# Patient Record
Sex: Male | Born: 1985 | Race: White | Hispanic: No | Smoking: Never smoker
Health system: Southern US, Community
[De-identification: ages and names within clinical notes are randomized; demographics above are authoritative.]

## PROBLEM LIST (undated history)

## (undated) HISTORY — PX: APPENDECTOMY: SHX54

---

## 2007-02-10 ENCOUNTER — Inpatient Hospital Stay: Payer: Self-pay | Admitting: General Surgery

## 2007-12-13 ENCOUNTER — Emergency Department: Payer: Self-pay | Admitting: Emergency Medicine

## 2018-05-26 ENCOUNTER — Other Ambulatory Visit: Payer: Self-pay | Admitting: Genetics

## 2018-05-26 DIAGNOSIS — Z1371 Encounter for nonprocreative screening for genetic disease carrier status: Secondary | ICD-10-CM

## 2018-05-26 DIAGNOSIS — Z13228 Encounter for screening for other metabolic disorders: Secondary | ICD-10-CM

## 2018-05-26 NOTE — Progress Notes (Signed)
FOB initially interested in carrier screening for cystic fibrosis, as partner has been identified as a carrier.  Couple ultimately elected to postpone testing today to further discuss their desire for screening [place] during pregnancy.  Will contact prenatal genetics should they desire carrier testing at a later date.  We reviewed the availability of [place] newborn screening program for cystic fibrosis.

## 2019-04-07 ENCOUNTER — Other Ambulatory Visit: Payer: Self-pay | Admitting: Family Medicine

## 2019-04-07 ENCOUNTER — Ambulatory Visit
Admission: RE | Admit: 2019-04-07 | Discharge: 2019-04-07 | Disposition: A | Discharge status: Home / Self Care | Payer: Medicaid Other | Source: Ambulatory Visit | Attending: Family Medicine | Admitting: Family Medicine

## 2019-04-07 DIAGNOSIS — R52 Pain, unspecified: Secondary | ICD-10-CM

## 2019-04-21 ENCOUNTER — Encounter: Payer: Self-pay | Admitting: Physical Therapy

## 2019-04-21 ENCOUNTER — Other Ambulatory Visit: Payer: Self-pay

## 2019-04-21 ENCOUNTER — Ambulatory Visit: Payer: Medicaid Other | Attending: Family Medicine | Admitting: Physical Therapy

## 2019-04-21 DIAGNOSIS — M5442 Lumbago with sciatica, left side: Secondary | ICD-10-CM | POA: Diagnosis not present

## 2019-04-21 DIAGNOSIS — M62830 Muscle spasm of back: Secondary | ICD-10-CM | POA: Insufficient documentation

## 2019-04-21 DIAGNOSIS — M5441 Lumbago with sciatica, right side: Secondary | ICD-10-CM | POA: Insufficient documentation

## 2019-04-21 NOTE — Therapy (Signed)
[facility] [address] [place], [address] Phone: [phone]   Fax:  [phone]  Physical Therapy Evaluation  Patient Details  Name: Michael Mccarthy MRN: [id number] Date of Birth: 1986-04-15 No data recorded  Encounter Date: 04/21/2019  PT End of Session - 04/21/19 1023    Visit Number  1    Number of Visits  13    Date for PT Re-Evaluation  06/02/19    PT Start Time  0948    PT Stop Time  1025    PT Time Calculation (min)  37 min    Behavior During Therapy  [facility] for tasks assessed/performed       History reviewed. No pertinent past medical history.  History reviewed. No pertinent surgical history.  There were no vitals filed for this visit.   OBJECTIVE  Mental Status Patient is oriented to person, place and time.  Recent memory is intact.  Remote memory is intact.  Attention span and concentration are intact.  Expressive speech is intact.  Patient's fund of knowledge is within normal limits for educational level.  SENSATION: Grossly intact to light touch bilateral LEs as determined by testing dermatomes L2-S2 Proprioception and hot/cold testing deferred on this date   MUSCULOSKELETAL: Tremor: None Bulk: Normal Tone: Normal    Posture Forward head, rounded shoulder, reduced lumbar lordosis.  Gait Normalized gait, minimal trunk rotation.    Palpation Visible swelling over L side of L2-S1, discomfort with noted tension/trigger points over L lumbar paraspinals/QL.    Strength (out of 5) R/L 5/5 Hip flexion 5/5 Hip ER 5/5 Hip IR 5/5 Hip abduction 5/5 Hip adduction 5/5 Hip extension 5/5 Knee extension 5/5 Knee flexion 5/5 Ankle dorsiflexion *Indicates pain   AROM (degrees) R/L (all movements include overpressure unless otherwise stated) Lumbar forward flexion (0-65):  25% limited with pain Lumbar extension (0-30): 75% limited with pain  Lumbar lateral flexion (0-25): WNL  bilat Lumbar rotation:  Pain at L lumbar paraspinals with L rotation 25% limited; R rotation WNL All hip motion WNL  PROM (degrees) PROM = AROM  Repeated Movements Centralization with repeated standing ext in minimal ROM  Passive Accessory Intervertebral Motion (PAIVM) Pt denies reproduction of back pain with CPA L1-L5 and UPA bilaterally L1-L5. Generally hypomobile throughout  Passive Physiological Intervertebral Motion (PPIVM) Normal flexion and extension with PPIVM testing   SPECIAL TESTS Slump: Negative bilat SLR: R:  Pain at L lumbar paraspinals with L rotation 25% limited; R rotation WNL Crossed SLR: L NEgative FABER: R: Positive for tension, minimal pain L: Negative FADIR: R: Positive for tension, minimal pain L: Negative  Hip scour: Negative bilat 90/90 Hamstring R Positive with pain L Positive without pain  Obers Negative bilat  Ther-Ex SKTC 10sec hold Lateral seated child pose 10sec hold Repeated lumbar ext x10 with centralization Education on repeated motion on pain, and stretching in pain free range with good understanding.  Evaluation shortened d/t patient arriving 85mins late. Would benefit from functional movement assessment next visit should time allow.              .   Pt will increase strength of by at least 1/2 MMT grade in order to demonstrate improvement in strength and function.          Subjective Assessment - 04/21/19 0950    Subjective  L sided LBP    Pertinent History  Patinet is a 33 year old with low back pain worsening  over the past month. Pt reports his pain started after he installed a window unit at the low back, felt like a pulled muscle. Following this, he reports he had L hip and post leg pain that feels like it is in his bone. The radiating pain has gotten better since predinsone taper he finished last week, but LBP persists. Has perscription for hydrocodone, but does not take them. Reports bilat LE numbness if he sits for  10mins. Reports L side of LBP is worse than R, feels dull and achy. Works full time in [employer] representative[employer], has difficulty with work duties d/t pain. Reports no difficulty with ADLS. Pt reports worst pain over the past week 8/10 best 3/10. Pt denies N/V, B&B changes, unexplained weight fluctuation, saddle paresthesia, fever, night sweats, or unrelenting night pain at this time.    Limitations  Sitting;Lifting;House hold activities    How long can you sit comfortably?  10mins    How long can you stand comfortably?  unlimited    How long can you walk comfortably?  unlimited    Diagnostic tests  Slight scoliosis. Slight disc space narrowing at L4-5 and L5-S1. No appreciable facet arthropathy. No fracture or spondylolisthesis.    Patient Stated Goals  decrease pain    Currently in Pain?  Yes    Pain Score  3     Pain Location  Back    Pain Orientation  Posterior;Lower;Left    Pain Descriptors / Indicators  Aching;Dull    Pain Type  Chronic pain    Pain Radiating Towards  bilat LEs with sitting    Pain Onset  More than a month ago    Pain Frequency  Constant    Aggravating Factors   bending, lifting, sitting    Pain Relieving Factors  predinsone taper    Effect of Pain on Daily Activities  Unable work duties without pain                    Objective measurements completed on examination: See above findings.              PT Education - 04/21/19 1022    Education Details  Patient was educated on diagnosis, anatomy and pathology involved, prognosis, role of PT, and was given an HEP, demonstrating exercise with proper form following verbal and tactile cues, and was given a paper hand out to continue exercise at home. Pt was educated on and agreed to plan of care.    Person(s) Educated  Patient    Methods  Explanation;Demonstration;Verbal cues;Handout    Comprehension  Verbalized understanding;Returned demonstration;Verbal cues required       PT Short Term Goals - 04/21/19 1024       PT SHORT TERM GOAL #1   Title  Pt will be independent with HEP in order to improve strength and decrease back pain in order to improve pain-free function at home and work.    Baseline  04/21/19 HEP handout given    Time  4    Period  Weeks    Status  New        PT Long Term Goals - 04/21/19 1025      PT LONG TERM GOAL #1   Title  Pt will decrease mODI scoreby at least 13 points in order demonstrate clinically significant reduction in back pain/disability.    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 Current impairments in core strength, back pain, BLE and trunk ROM, and LE sensation deficits. 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 Patient will benefit from skilled PT to address impairments in order to return to safe, PLOF and decrease pain to increase efficiency at work and QOL.    Personal Factors and Comorbidities  Education;Profession;Fitness;Comorbidity 1    Comorbidities  obesity    Examination-Activity Limitations  Sit;Lift;Squat;Carry     Examination-Participation Restrictions  Community Activity;Driving;Other   Holiday representative work   Conservation officer, historic buildings  Evolving/Moderate complexity    Clinical Decision Making  Moderate    Rehab Potential  Good    PT Frequency  2x / week    PT Duration  6 weeks    PT Treatment/Interventions  Aquatic Therapy;Moist Heat;Traction;Dry needling;Joint Manipulations;Spinal Manipulations;Passive range of motion;Manual techniques;Electrical Stimulation;Cryotherapy;ADLs/Self Care Home Management;Ultrasound;Therapeutic exercise;Therapeutic activities;Functional mobility training;Neuromuscular re-education;Patient/family education    PT Next Visit Plan  HEP review, possible TDN, squat assessment    PT Home Exercise Plan  SKTC, childs pose seated, repeated ext    Consulted and Agree with Plan of Care  Patient       Patient will benefit from skilled therapeutic intervention in order to improve the following deficits and impairments:  Decreased endurance, Decreased mobility, Increased muscle spasms, Impaired sensation, Improper body mechanics, Decreased range of motion, Decreased activity tolerance, Decreased strength, Increased fascial restricitons, Impaired flexibility, Postural dysfunction, Pain  Visit Diagnosis: Acute left-sided low back pain with bilateral sciatica  Muscle spasm of back     Problem List There are no active problems to display for this patient.  Staci Acosta PT, DPT Staci Acosta 04/21/2019, 1:20 PM  [facility] [address] [place], [address] Phone: [phone]   Fax:  [phone]  Name: ARSHAUN ROSENGRANT MRN: [id number] Date of Birth: Feb 18, 1986

## 2019-04-28 ENCOUNTER — Encounter: Payer: Medicaid Other | Admitting: Physical Therapy

## 2019-04-29 ENCOUNTER — Ambulatory Visit: Payer: Medicaid Other | Admitting: Physical Therapy

## 2019-04-30 ENCOUNTER — Encounter: Payer: Medicaid Other | Admitting: Physical Therapy

## 2019-05-05 ENCOUNTER — Ambulatory Visit: Payer: Medicaid Other | Admitting: Physical Therapy

## 2019-05-05 DIAGNOSIS — M545 Low back pain, unspecified: Secondary | ICD-10-CM | POA: Insufficient documentation

## 2019-05-07 ENCOUNTER — Ambulatory Visit: Payer: Medicaid Other | Admitting: Physical Therapy

## 2019-05-11 ENCOUNTER — Ambulatory Visit: Payer: Medicaid Other | Admitting: Physical Therapy

## 2019-05-14 ENCOUNTER — Ambulatory Visit: Payer: Medicaid Other | Admitting: Physical Therapy

## 2019-05-18 ENCOUNTER — Encounter: Payer: Medicaid Other | Admitting: Physical Therapy

## 2019-05-21 ENCOUNTER — Encounter: Payer: Medicaid Other | Admitting: Physical Therapy

## 2019-07-15 ENCOUNTER — Encounter: Payer: Self-pay | Admitting: Emergency Medicine

## 2019-07-15 ENCOUNTER — Other Ambulatory Visit: Payer: Self-pay

## 2019-07-15 ENCOUNTER — Emergency Department
Admission: EM | Admit: 2019-07-15 | Discharge: 2019-07-16 | Disposition: A | Discharge status: Home / Self Care | Payer: Medicaid Other | Attending: Student | Admitting: Student

## 2019-07-15 DIAGNOSIS — R1033 Periumbilical pain: Secondary | ICD-10-CM | POA: Diagnosis not present

## 2019-07-15 DIAGNOSIS — R110 Nausea: Secondary | ICD-10-CM | POA: Diagnosis not present

## 2019-07-15 DIAGNOSIS — K529 Noninfective gastroenteritis and colitis, unspecified: Secondary | ICD-10-CM | POA: Insufficient documentation

## 2019-07-15 DIAGNOSIS — R1013 Epigastric pain: Secondary | ICD-10-CM | POA: Diagnosis present

## 2019-07-15 LAB — URINALYSIS, COMPLETE (UACMP) WITH MICROSCOPIC
Bacteria, UA: NONE SEEN
Bilirubin Urine: NEGATIVE
Glucose, UA: NEGATIVE mg/dL
Hgb urine dipstick: NEGATIVE
Ketones, ur: NEGATIVE mg/dL
Leukocytes,Ua: NEGATIVE
Nitrite: NEGATIVE
Protein, ur: NEGATIVE mg/dL
Specific Gravity, Urine: 1.017 (ref 1.005–1.030)
Squamous Epithelial / HPF: NONE SEEN (ref 0–5)
WBC, UA: NONE SEEN WBC/hpf (ref 0–5)
pH: 5 (ref 5.0–8.0)

## 2019-07-15 LAB — TROPONIN I (HIGH SENSITIVITY): Troponin I (High Sensitivity): 7 ng/L (ref <18)

## 2019-07-15 LAB — COMPREHENSIVE METABOLIC PANEL
ALT: 27 U/L (ref 0–44)
AST: 19 U/L (ref 15–41)
Albumin: 3.9 g/dL (ref 3.5–5.0)
Alkaline Phosphatase: 57 U/L (ref 38–126)
Anion gap: 10 (ref 5–15)
BUN: 12 mg/dL (ref 6–20)
CO2: 24 mmol/L (ref 22–32)
Calcium: 10.2 mg/dL (ref 8.9–10.3)
Chloride: 105 mmol/L (ref 98–111)
Creatinine, Ser: 0.94 mg/dL (ref 0.61–1.24)
GFR calc Af Amer: >60 mL/min (ref >60)
GFR calc non Af Amer: >60 mL/min (ref >60)
Glucose, Bld: 118 mg/dL — ABNORMAL HIGH (ref 70–99)
Potassium: 3.8 mmol/L (ref 3.5–5.1)
Sodium: 139 mmol/L (ref 135–145)
Total Bilirubin: 0.9 mg/dL (ref 0.3–1.2)
Total Protein: 7.4 g/dL (ref 6.5–8.1)

## 2019-07-15 LAB — CBC
HCT: 48.6 % (ref 39.0–52.0)
Hemoglobin: 15.9 g/dL (ref 13.0–17.0)
MCH: 29 pg (ref 26.0–34.0)
MCHC: 32.7 g/dL (ref 30.0–36.0)
MCV: 88.5 fL (ref 80.0–100.0)
Platelets: 329 10*3/uL (ref 150–400)
RBC: 5.49 MIL/uL (ref 4.22–5.81)
RDW: 13.5 % (ref 11.5–15.5)
WBC: 13.1 10*3/uL — ABNORMAL HIGH (ref 4.0–10.5)
nRBC: 0 % (ref 0.0–0.2)

## 2019-07-15 LAB — LIPASE, BLOOD: Lipase: 30 U/L (ref 11–51)

## 2019-07-15 MED ORDER — SODIUM CHLORIDE 0.9% FLUSH: 3.0000 mL | Freq: Once | INTRAVENOUS | Status: DC

## 2019-07-15 MED ORDER — SODIUM CHLORIDE 0.9 % IV BOLUS
1000.0000 mL | Freq: Once | INTRAVENOUS | Status: AC
  Administered 2019-07-15: 1000 mL via INTRAVENOUS

## 2019-07-15 MED ORDER — FENTANYL CITRATE (PF) 100 MCG/2ML IJ SOLN
50.0000 ug | Freq: Once | INTRAMUSCULAR | Status: AC
  Administered 2019-07-15: 50 ug via INTRAVENOUS
  Filled 2019-07-15: qty 2

## 2019-07-15 NOTE — ED Notes (Signed)
Pt denies any c/o's pain at this time, but reports it comes on regularly and is "excrucitating".

## 2019-07-15 NOTE — ED Triage Notes (Signed)
C/O mid/ upper abdominal pain x 1 day.  Symptoms include nausea.  AAOx3.  Skin warm and dry. NAD

## 2019-07-15 NOTE — ED Provider Notes (Signed)
[facility] Emergency Department Provider Note  ____________________________________________   First MD Initiated Contact with Patient 07/15/19 2301     (approximate)  I have reviewed the triage vital signs and the nursing notes.  History  Chief Complaint Abdominal Pain    HPI BRANDUN PINN is a 33 y.o. male with no significant medical history who presents to the emergency department for abdominal pain.  Patient reports symptoms started 2 days ago.  Primarily epigastric and periumbilical in location.  Pain is intermittent, comes and goes in waves with no identifiable inciting factors, however he does notice that the pain is worsened by eating. No alleviating factors. Described as sharp, moderate in severity.  No radiation.  Associated with nausea, but no vomiting or diarrhea.  No fevers.  He states symptoms feel similar to when he was diagnosed with appendicitis.     Past Medical Hx History reviewed. No pertinent past medical history.  Problem List There are no active problems to display for this patient.   Past Surgical Hx Past Surgical History:  Procedure Laterality Date  . APPENDECTOMY      Medications Prior to Admission medications   Not on File    Allergies Vicodin [hydrocodone-acetaminophen]  Family Hx No family history on file.  Social Hx Social History   Tobacco Use  . Smoking status: Never Smoker  . Smokeless tobacco: Never Used  Substance Use Topics  . Alcohol use: Not on file  . Drug use: Not on file     Review of Systems  Constitutional: Negative for fever, chills. Eyes: Negative for visual changes. ENT: Negative for sore throat. Cardiovascular: Negative for chest pain. Respiratory: Negative for shortness of breath. Gastrointestinal: Negative for nausea, vomiting. + abdominal pain Genitourinary: Negative for dysuria. Musculoskeletal: Negative for leg swelling. Skin: Negative for rash. Neurological: Negative for  for headaches.   Physical Exam  Vital Signs: ED Triage Vitals  Enc Vitals Group     BP 07/15/19 1819 125/71     Pulse Rate 07/15/19 1819 100     Resp 07/15/19 1819 16     Temp 07/15/19 1819 98.7 F (37.1 C)     Temp Source 07/15/19 1819 Oral     SpO2 07/15/19 1819 96 %     Weight 07/15/19 1814 280 lb (127 kg)     Height 07/15/19 1814 6' (1.829 m)     Head Circumference --      Peak Flow --      Pain Score 07/15/19 2307 0     Pain Loc --      Pain Edu? --      Excl. in GC? --     Constitutional: Alert and oriented.  Head: Normocephalic. Atraumatic. Eyes: Conjunctivae clear. Sclera anicteric. Nose: No congestion. No rhinorrhea. Mouth/Throat: Wearing mask.  Neck: No stridor.   Cardiovascular: Normal rate, regular rhythm. Extremities well perfused. Respiratory: Normal respiratory effort.  Lungs CTAB. Gastrointestinal: Soft. TTP periumbilically. No rebound or guarding. Non-distended.  Musculoskeletal: No lower extremity edema. No deformities. Neurologic:  Normal speech and language. No gross focal neurologic deficits are appreciated.  Skin: Skin is warm, dry and intact. No rash noted. Psychiatric: Mood and affect are appropriate for situation.  EKG  Personally reviewed.   Rate: 87 Rhythm: sinus Axis: normal Intervals: WNL No STEMI    Radiology  CT: IMPRESSION:  Changes consistent with inflammatory bowel disease in several of the  distal ileal loops. No definitive obstruction is seen although some  mild  prominence of the more proximal ileum is noted. The jejunum is  unremarkable.   Changes consistent with prior appendectomy.    Procedures  Procedure(s) performed (including critical care):  Procedures   Initial Impression / Assessment and Plan / ED Course  33 y.o. male who presents to the ED who presents for periumbilical abdominal pain. S/p appendectomy.  Ddx: pancreatitis, gastritis, biliary colic, colitis, nephrolithiasis  Will obtain labs,  imaging, symptom control and reassess.   Labs reveal mild leukocytosis to 13.1. Otherwise electrolytes, lipase, LFTs, urinalysis without actionable derangements.  CT scan reveals changes consistent with inflammatory bowel disease in the distal ileal loops, no obstruction.  Discussed findings with patient.  He states he was once told many years ago as a pediatric patient that he had an "issue" with his intestines.  Suspect likely underlying Crohn's versus UC. He has never been evaluated by GI before. Advised close follow-up with GI, referral placed. Pain improved and patient has tolerated PO in the ED.  Otherwise, plan for symptomatic care with Rx for Zofran, Bentyl.   Patient voices understanding and is comfortable with the plan and discharge.   Final Clinical Impression(s) / ED Diagnosis  Final diagnoses:  Periumbilical abdominal pain       Note:  This document was prepared using Dragon voice recognition software and may include unintentional dictation errors.   Lilia Pro., MD 07/16/19 [phone]

## 2019-07-16 ENCOUNTER — Encounter: Payer: Self-pay | Admitting: Radiology

## 2019-07-16 ENCOUNTER — Emergency Department: Payer: Medicaid Other

## 2019-07-16 MED ORDER — MORPHINE SULFATE (PF) 2 MG/ML IV SOLN
2.0000 mg | Freq: Once | INTRAVENOUS | Status: AC
  Administered 2019-07-16: 2 mg via INTRAVENOUS
  Filled 2019-07-16: qty 1

## 2019-07-16 MED ORDER — ONDANSETRON HCL 4 MG/2ML IJ SOLN
4.0000 mg | Freq: Once | INTRAMUSCULAR | Status: AC
  Administered 2019-07-16: 4 mg via INTRAVENOUS
  Filled 2019-07-16: qty 2

## 2019-07-16 MED ORDER — IOHEXOL 300 MG/ML  SOLN
100.0000 mL | Freq: Once | INTRAMUSCULAR | Status: AC | PRN
  Administered 2019-07-16: 100 mL via INTRAVENOUS

## 2019-07-16 MED ORDER — OXYCODONE HCL 5 MG PO TABS
5.0000 mg | ORAL_TABLET | Freq: Once | ORAL | Status: AC
  Administered 2019-07-16: 5 mg via ORAL
  Filled 2019-07-16: qty 1

## 2019-07-16 MED ORDER — ONDANSETRON HCL 4 MG PO TABS: 4.0000 mg | ORAL_TABLET | Freq: Three times a day (TID) | ORAL | 0 refills | Status: AC | PRN

## 2019-07-16 MED ORDER — DICYCLOMINE HCL 10 MG PO CAPS
10.0000 mg | ORAL_CAPSULE | Freq: Once | ORAL | Status: AC
  Administered 2019-07-16: 10 mg via ORAL
  Filled 2019-07-16: qty 1

## 2019-07-16 MED ORDER — DICYCLOMINE HCL 20 MG PO TABS: 20.0000 mg | ORAL_TABLET | Freq: Three times a day (TID) | ORAL | 0 refills | Status: DC | PRN

## 2019-07-16 NOTE — Discharge Instructions (Addendum)
Thank you for letting us take care of you in the emergency department today.   Please follow up with: - The GI doctor, information below  Please return to the ER for any new or worsening symptoms.

## 2019-07-16 NOTE — ED Notes (Signed)
Pt given graham crackers and apple juice at this time to assess pt's ability to tolerate PO intake, per Dr Joan Mayans.

## 2019-09-14 ENCOUNTER — Encounter (INDEPENDENT_AMBULATORY_CARE_PROVIDER_SITE_OTHER): Payer: Self-pay

## 2019-09-14 ENCOUNTER — Ambulatory Visit: Payer: Medicaid Other | Admitting: Gastroenterology

## 2019-09-14 ENCOUNTER — Other Ambulatory Visit: Payer: Self-pay

## 2019-09-14 VITALS — BP 156/88 | HR 80 | Temp 97.5°F | Ht 72.0 in | Wt 290.8 lb

## 2019-09-14 DIAGNOSIS — Z791 Long term (current) use of non-steroidal anti-inflammatories (NSAID): Secondary | ICD-10-CM

## 2019-09-14 DIAGNOSIS — G43C1 Periodic headache syndromes in child or adult, intractable: Secondary | ICD-10-CM

## 2019-09-14 DIAGNOSIS — R933 Abnormal findings on diagnostic imaging of other parts of digestive tract: Secondary | ICD-10-CM | POA: Diagnosis not present

## 2019-09-14 NOTE — Progress Notes (Signed)
Jonathon Bellows MD, MRCP(U.K) [address]  [place]  [place], [place] [address]  Main: [phone]  Fax: [phone]   Gastroenterology Consultation  Referring Provider:   Emergency room  primary Care Physician:  Patient, No Pcp Per Primary Gastroenterologist:  Dr. Jonathon Bellows  Reason for Consultation:     Abdominal pain        HPI:   JUSTINE COSSIN is a 34 y.o. y/o male presented to the emergency room on 07/15/2019 with abdominal pain.  The pain began 2 days prior to his presentation.  He did have some nausea.  It was similar in nature when he was diagnosed with appendicitis.  Emergency room notes were reviewed and summarized below  He had a CT scan of the abdomen and pelvis with contrast which demonstrated features consistent with inflammatory bowel disease in several of the distal ileal loops.  No definite obstruction was seen although mild prominence of the proximal ileum was noted.  Jejunum was unremarkable.  Colon was within normal limits.  Changes were noted in the distal ileum.   07/15/2019: Hemoglobin 15.9, white cell count 13.1.  Platelet count 329.  LFTs normal.  He feels that at the age of 71 on one occasion he had severe abdominal pain and was admitted to [facility] for couple of days and was told he had a rare condition of his abdomen.  Since then he has had no treatment and no issues with his abdomen.  He states that for many years he has been taking ibuprofen 1200 to 1400 mg/day for headaches.  No protection in terms of acid production.  He does not see a neurologist and does not have a primary care physician.  No family history of ulcerative colitis or Crohn's disease.  He is not a smoker nor has he quit smoking presently.  Some joint pains but there are no skin rashes   No past medical history on file.  Past Surgical History:  Procedure Laterality Date  . APPENDECTOMY      Prior to Admission medications   Medication Sig Start Date End Date Taking? Authorizing  Provider  dicyclomine (BENTYL) 20 MG tablet Take 1 tablet (20 mg total) by mouth 3 (three) times daily as needed for spasms. 07/16/19 07/15/20  Lilia Pro., MD    No family history on file.   Social History   Tobacco Use  . Smoking status: Never Smoker  . Smokeless tobacco: Never Used  Substance Use Topics  . Alcohol use: Not on file  . Drug use: Not on file    Allergies as of 09/14/2019 - Review Complete 09/14/2019  Allergen Reaction Noted  . Vicodin [hydrocodone-acetaminophen]  07/15/2019    Review of Systems:    All systems reviewed and negative except where noted in HPI.   Physical Exam:  BP (!) 156/88   Pulse 80   Temp (!) 97.5 F (36.4 C)   Ht 6' (1.829 m)   Wt 290 lb 12.8 oz (131.9 kg)   BMI 39.44 kg/m  No LMP for male patient. Psych:  Alert and cooperative. Normal mood and affect. General:   Alert,  Well-developed, well-nourished, pleasant and cooperative in NAD Head:  Normocephalic and atraumatic. Eyes:  Sclera clear, no icterus.   Conjunctiva pink. Ears:  Normal auditory acuity. Neck:  Supple; no masses or thyromegaly. Lungs:  Respirations even and unlabored.  Clear throughout to auscultation.   No wheezes, crackles, or rhonchi. No acute distress. Heart:  Regular rate  and rhythm; no murmurs, clicks, rubs, or gallops. Abdomen:  Normal bowel sounds.  No bruits.  Soft, non-tender and non-distended without masses, hepatosplenomegaly or hernias noted.  No guarding or rebound tenderness.    Extremities:  No clubbing or edema.  No cyanosis. Neurologic:  Alert and oriented x3;  grossly normal neurologically. Psych:  Alert and cooperative. Normal mood and affect.  Imaging Studies: No results found.  Assessment and Plan:   JAMEIR AKE is a 34 y.o. y/o male has been referred for an abnormal CT scan of the abdomen which was noted on 07/15/2019 when he presented to the emergency room with abdominal pain of 2 days duration.  His CT scan of the abdomen shows  features which can be seen in Crohn's disease of the small bowel.  At times and enteritis due to an infection or NSAID related enteritis can present with similar issues.  At this point the decision to be made is does he have or  not have Crohn's disease.  It is very likely that all these changes are due to NSAID use for many years.  An MR enterography will look at the small bowel and evaluate for any active inflammation.  It is very sensitive for detection of the same.  I will also proceed with colonoscopy and biopsies of the terminal ileum.  Plan 1.  Fecal calprotectin, CRP, CBC, CMP 2.  MR enterography followed by colonoscopy and biopsies of the terminal ileum in 3 months 3.  Avoid all NSAIDs.  I have asked him to stop all NSAIDs right away and I will refer him to neurologist for his migraines 4.  I would also suggest him that he establishes care with a primary care physician.  I will be referring him to Vesta Mixer., Hassan Buckler   I have discussed alternative options, risks & benefits,  which include, but are not limited to, bleeding, infection, perforation,respiratory complication & drug reaction.  The patient agrees with this plan & written consent will be obtained.    Follow up in 4 months    Dr Wyline Mood MD,MRCP(U.K)

## 2019-09-15 ENCOUNTER — Encounter: Payer: Self-pay | Admitting: Gastroenterology

## 2019-09-15 LAB — COMPREHENSIVE METABOLIC PANEL
ALT: 37 IU/L (ref 0–44)
AST: 25 IU/L (ref 0–40)
Albumin/Globulin Ratio: 1.5 (ref 1.2–2.2)
Albumin: 4.1 g/dL (ref 4.0–5.0)
Alkaline Phosphatase: 72 IU/L (ref 39–117)
BUN/Creatinine Ratio: 11 (ref 9–20)
BUN: 11 mg/dL (ref 6–20)
Bilirubin Total: 0.4 mg/dL (ref 0.0–1.2)
CO2: 21 mmol/L (ref 20–29)
Calcium: 10.7 mg/dL — ABNORMAL HIGH (ref 8.7–10.2)
Chloride: 105 mmol/L (ref 96–106)
Creatinine, Ser: 1.01 mg/dL (ref 0.76–1.27)
GFR calc Af Amer: 111 mL/min/{1.73_m2} (ref >59)
GFR calc non Af Amer: 96 mL/min/{1.73_m2} (ref >59)
Globulin, Total: 2.7 g/dL (ref 1.5–4.5)
Glucose: 91 mg/dL (ref 65–99)
Potassium: 4.3 mmol/L (ref 3.5–5.2)
Sodium: 139 mmol/L (ref 134–144)
Total Protein: 6.8 g/dL (ref 6.0–8.5)

## 2019-09-15 LAB — CBC WITH DIFFERENTIAL/PLATELET
Basophils Absolute: 0.1 10*3/uL (ref 0.0–0.2)
Basos: 1 %
EOS (ABSOLUTE): 0.4 10*3/uL (ref 0.0–0.4)
Eos: 5 %
Hematocrit: 44.7 % (ref 37.5–51.0)
Hemoglobin: 15 g/dL (ref 13.0–17.7)
Immature Grans (Abs): 0 10*3/uL (ref 0.0–0.1)
Immature Granulocytes: 0 %
Lymphocytes Absolute: 3.1 10*3/uL (ref 0.7–3.1)
Lymphs: 40 %
MCH: 29 pg (ref 26.6–33.0)
MCHC: 33.6 g/dL (ref 31.5–35.7)
MCV: 86 fL (ref 79–97)
Monocytes Absolute: 0.6 10*3/uL (ref 0.1–0.9)
Monocytes: 7 %
Neutrophils Absolute: 3.7 10*3/uL (ref 1.4–7.0)
Neutrophils: 47 %
Platelets: 301 10*3/uL (ref 150–450)
RBC: 5.18 x10E6/uL (ref 4.14–5.80)
RDW: 12.7 % (ref 11.6–15.4)
WBC: 7.9 10*3/uL (ref 3.4–10.8)

## 2019-09-15 LAB — C-REACTIVE PROTEIN: CRP: 4 mg/L (ref 0–10)

## 2019-09-17 ENCOUNTER — Ambulatory Visit: Payer: Medicaid Other | Admitting: Gastroenterology

## 2019-10-02 LAB — CALPROTECTIN, FECAL: Calprotectin, Fecal: 37 ug/g (ref 0–120)

## 2019-10-11 ENCOUNTER — Encounter: Payer: Self-pay | Admitting: Gastroenterology

## 2019-10-14 NOTE — Progress Notes (Signed)
Virtual Visit via Video Note The purpose of this virtual visit is to provide medical care while limiting exposure to the novel coronavirus.    Consent was obtained for video visit:  Yes.   Answered questions that patient had about telehealth interaction:  Yes.   I discussed the limitations, risks, security and privacy concerns of performing an evaluation and management service by telemedicine. I also discussed with the patient that there may be a patient responsible charge related to this service. The patient expressed understanding and agreed to proceed.  Pt location: Home Physician Location: office Name of referring provider:  Wyline Mood, MD I connected with Michael Mccarthy at patients initiation/request on 10/16/2019 at  9:10 AM EST by video enabled telemedicine application and verified that I am speaking with the correct person using two identifiers. Pt MRN:  [id number] Pt DOB:  1986/04/22 Video Participants:  Michael Mccarthy   History of Present Illness:  Michael Mccarthy is a 34 year old male who presents for headaches.  History supplemented by ED and referring provider notes.  Onset:  Teenager Location:  Starts across front but with radiate up neck and back of head. Quality:  pounding Intensity:  Severe.  He denies new headache, thunderclap headache.  Sometimes wakes up with migraine. Aura:  none Premonitory Phase:  none Postdrome:  none Associated symptoms:  Nausea, vomiting, photophobia, phonophobia.  He denies associated visual disturbance, unilateral numbness or weakness. Duration:  All day or sooner if he vomits Frequency:  On average 2 to 3 a week Frequency of abortive medication: now nothing.  Previously took high doses of ibuprofen daily. Triggers:  unknown Relieving factors:  vomiting Activity:  Aggravates.  Needs to lay down in dark room.  He had been taking ibuprofen 1200mg  to 1600mg  daily.  He was recently seen by gastroenterology for abdominal pain.  He presented to  the ED in November for abdominal pain where CT of his abdomen showed inflammatory changes.  He is being worked up for disease.  He was advised to discontinue use of NSAIDs.  Current NSAIDS:  none Current analgesics:  none Current triptans:  none Current ergotamine:  none Current anti-emetic:  none Current muscle relaxants:  none Current anti-anxiolytic:  none Current sleep aide:  none Current Antihypertensive medications:  none Current Antidepressant medications:  none Current Anticonvulsant medications:  none Current anti-CGRP:  none Current Vitamins/Herbal/Supplements:  none Current Antihistamines/Decongestants:  none Other therapy:  none Hormone/birth control:  none  Past NSAIDS:  Ibuprofen; Aleve Past analgesics:  Excedrin migraine Past abortive triptans:  none Past abortive ergotamine:  none Past muscle relaxants:  none Past anti-emetic:  none Past antihypertensive medications:  none Past antidepressant medications:  none Past anticonvulsant medications:  none Past anti-CGRP:  none Past vitamins/Herbal/Supplements:  none Past antihistamines/decongestants:  none Other past therapies:  none  Caffeine:  Energy drinks.  Rarely coffee.  No soda. Alcohol:  rarely Diet:  Plenty of water Exercise:  Not routine.  December. Depression:  no; Anxiety:  some Other pain:  Shoulder pain Sleep:  Some trouble falling asleep Family history of headache:  Mother; grandmother; 2 aunts.  Past Medical History: No past medical history on file.  Medications: Outpatient Encounter Medications as of 10/16/2019  Medication Sig Note  . dicyclomine (BENTYL) 20 MG tablet Take 1 tablet (20 mg total) by mouth 3 (three) times daily as needed for spasms. 09/14/2019: Pt reports taking PRN   No facility-administered encounter medications on file as  of 10/16/2019.    Allergies: Allergies  Allergen Reactions  . Vicodin [Hydrocodone-Acetaminophen]     Family History: No family  history on file.  Social History: Social History   Socioeconomic History  . Marital status: Legally Separated    Spouse name: Not on file  . Number of children: Not on file  . Years of education: Not on file  . Highest education level: Not on file  Occupational History  . Not on file  Tobacco Use  . Smoking status: Never Smoker  . Smokeless tobacco: Never Used  Substance and Sexual Activity  . Alcohol use: Not on file  . Drug use: Not on file  . Sexual activity: Not on file  Other Topics Concern  . Not on file  Social History Narrative  . Not on file   Social Determinants of Health   Financial Resource Strain:   . Difficulty of Paying Living Expenses: Not on file  Food Insecurity:   . Worried About Charity fundraiser in the Last Year: Not on file  . Ran Out of Food in the Last Year: Not on file  Transportation Needs:   . Lack of Transportation (Medical): Not on file  . Lack of Transportation (Non-Medical): Not on file  Physical Activity:   . Days of Exercise per Week: Not on file  . Minutes of Exercise per Session: Not on file  Stress:   . Feeling of Stress : Not on file  Social Connections:   . Frequency of Communication with Friends and Family: Not on file  . Frequency of Social Gatherings with Friends and Family: Not on file  . Attends Religious Services: Not on file  . Active Member of Clubs or Organizations: Not on file  . Attends Archivist Meetings: Not on file  . Marital Status: Not on file  Intimate Partner Violence:   . Fear of Current or Ex-Partner: Not on file  . Emotionally Abused: Not on file  . Physically Abused: Not on file  . Sexually Abused: Not on file    Observations/Objective:   There were no vitals taken for this visit. No acute distress.  Alert and oriented.  Speech fluent and not dysarthric.  Language intact.  Eyes orthophoric on primary gaze.  Face symmetric.  Assessment and Plan:   Chronic migraine without aura, without  status migrainosus, intractable  1.  For preventative management, start nortriptyline 10mg  at bedtime.  We can increase dose to 25mg  in 4 weeks if needed. 2.  For abortive therapy, rizatriptan 10mg .  Zofran 4mg  for nausea PRN 3.  Limit use of pain relievers to no more than 2 days out of week to prevent risk of rebound or medication-overuse headache. 4.  Keep headache diary 5.  Exercise, hydration, caffeine cessation/discontinue energy drinks, sleep hygiene, monitor for and avoid triggers 6.  Follow up 4 months.   Follow Up Instructions:    -I discussed the assessment and treatment plan with the patient. The patient was provided an opportunity to ask questions and all were answered. The patient agreed with the plan and demonstrated an understanding of the instructions.   The patient was advised to call back or seek an in-person evaluation if the symptoms worsen or if the condition fails to improve as anticipated.    Dudley Major, DO

## 2019-10-16 ENCOUNTER — Other Ambulatory Visit: Payer: Self-pay

## 2019-10-16 ENCOUNTER — Encounter: Payer: Self-pay | Admitting: Neurology

## 2019-10-16 ENCOUNTER — Telehealth (INDEPENDENT_AMBULATORY_CARE_PROVIDER_SITE_OTHER): Payer: Medicaid Other | Admitting: Neurology

## 2019-10-16 DIAGNOSIS — G43019 Migraine without aura, intractable, without status migrainosus: Secondary | ICD-10-CM

## 2019-10-16 MED ORDER — NORTRIPTYLINE HCL 10 MG PO CAPS: 10.0000 mg | ORAL_CAPSULE | Freq: Every day | ORAL | 3 refills | Status: DC

## 2019-10-16 MED ORDER — ONDANSETRON HCL 4 MG PO TABS: 4.0000 mg | ORAL_TABLET | Freq: Three times a day (TID) | ORAL | 3 refills | Status: AC | PRN

## 2019-10-16 MED ORDER — RIZATRIPTAN BENZOATE 10 MG PO TABS: ORAL_TABLET | ORAL | 3 refills | Status: DC

## 2019-12-11 ENCOUNTER — Other Ambulatory Visit: Payer: Self-pay

## 2019-12-11 DIAGNOSIS — T1590XA Foreign body on external eye, part unspecified, unspecified eye, initial encounter: Secondary | ICD-10-CM

## 2019-12-11 NOTE — Progress Notes (Signed)
or

## 2019-12-14 ENCOUNTER — Ambulatory Visit: Payer: Medicaid Other

## 2019-12-14 ENCOUNTER — Ambulatory Visit: Admission: RE | Admit: 2019-12-14 | Payer: Medicaid Other | Source: Ambulatory Visit

## 2020-02-16 NOTE — Progress Notes (Deleted)
NEUROLOGY FOLLOW UP OFFICE NOTE  OSBORN PULLIN [id number]  HISTORY OF PRESENT ILLNESS: Michael Mccarthy is a 34 year old male who follows up for migraines.  UPDATE: Started nortriptyline in February.  Intensity:  *** Duration:  *** Frequency:  *** Frequency of abortive medication: *** Current NSAIDS:  none Current analgesics:  none Current triptans:  rizatriptan 10mg  Current ergotamine:  none Current anti-emetic:  Zofran 4mg  Current muscle relaxants:  none Current anti-anxiolytic:  none Current sleep aide:  none Current Antihypertensive medications:  none Current Antidepressant medications:  nortriptyline 10mg  at bedtime Current Anticonvulsant medications:  none Current anti-CGRP:  none Current Vitamins/Herbal/Supplements:  none Current Antihistamines/Decongestants:  none Other therapy:  none Hormone/birth control:  none  Caffeine:  Energy drinks.  Rarely coffee.  No soda. Alcohol:  rarely Diet:  Plenty of water Exercise:  Not routine.  . Depression:  no; Anxiety:  some Other pain:  Shoulder pain Sleep:  Some trouble falling asleep  HISTORY: Onset:  Teenager Location:  Starts across front but with radiate up neck and back of head. Quality:  pounding Initial intensity:  Severe.  He denies new headache, thunderclap headache.  Sometimes wakes up with migraine. Aura:  none Premonitory Phase:  none Postdrome:  none Associated symptoms:  Nausea, vomiting, photophobia, phonophobia.  He denies associated visual disturbance, unilateral numbness or weakness. Initial duration:  All day or sooner if he vomits Initial frequency:  On average 2 to 3 a week Initial frequency of abortive medication: now nothing.  Previously took high doses of ibuprofen daily. Triggers:  unknown Relieving factors:  vomiting Activity:  Aggravates.  Needs to lay down in dark room.  Past NSAIDS:  Ibuprofen; Aleve Past analgesics:  Excedrin migraine Past abortive triptans:   none Past abortive ergotamine:  none Past muscle relaxants:  none Past anti-emetic:  none Past antihypertensive medications:  none Past antidepressant medications:  none Past anticonvulsant medications:  none Past anti-CGRP:  none Past vitamins/Herbal/Supplements:  none Past antihistamines/decongestants:  none Other past therapies:  none   Family history of headache:  Mother; grandmother; 2 aunts.  PAST MEDICAL HISTORY: No past medical history on file.  MEDICATIONS: Current Outpatient Medications on File Prior to Visit  Medication Sig Dispense Refill  . dicyclomine (BENTYL) 20 MG tablet Take 1 tablet (20 mg total) by mouth 3 (three) times daily as needed for spasms. 30 tablet 0  . nortriptyline (PAMELOR) 10 MG capsule Take 1 capsule (10 mg total) by mouth at bedtime. 30 capsule 3  . ondansetron (ZOFRAN) 4 MG tablet Take 1 tablet (4 mg total) by mouth every 8 (eight) hours as needed for nausea or vomiting. 20 tablet 3  . rizatriptan (MAXALT) 10 MG tablet Take 1 tablet earliest onset of migraine.  May repeat in 2 hours if needed.  Maximum 2 tablets in 24 hours 10 tablet 3   No current facility-administered medications on file prior to visit.    ALLERGIES: Allergies  Allergen Reactions  . Vicodin [Hydrocodone-Acetaminophen]     FAMILY HISTORY: No family history on file.  SOCIAL HISTORY: Social History   Socioeconomic History  . Marital status: Legally Separated    Spouse name: Not on file  . Number of children: Not on file  . Years of education: Not on file  . Highest education level: Not on file  Occupational History  . Not on file  Tobacco Use  . Smoking status: Never Smoker  . Smokeless tobacco: Never Used  Substance and Sexual  Activity  . Alcohol use: Not on file  . Drug use: Not on file  . Sexual activity: Not on file  Other Topics Concern  . Not on file  Social History Narrative  . Not on file   Social Determinants of Health   Financial Resource  Strain:   . Difficulty of Paying Living Expenses:   Food Insecurity:   . Worried About Programme researcher, broadcasting/film/video in the Last Year:   . Barista in the Last Year:   Transportation Needs:   . Freight forwarder (Medical):   Marland Kitchen Lack of Transportation (Non-Medical):   Physical Activity:   . Days of Exercise per Week:   . Minutes of Exercise per Session:   Stress:   . Feeling of Stress :   Social Connections:   . Frequency of Communication with Friends and Family:   . Frequency of Social Gatherings with Friends and Family:   . Attends Religious Services:   . Active Member of Clubs or Organizations:   . Attends Banker Meetings:   Marland Kitchen Marital Status:   Intimate Partner Violence:   . Fear of Current or Ex-Partner:   . Emotionally Abused:   Marland Kitchen Physically Abused:   . Sexually Abused:      PHYSICAL EXAM: *** General: No acute distress.  Patient appears well-groomed.   Head:  Normocephalic/atraumatic Eyes:  Fundi examined but not visualized Neck: supple, no paraspinal tenderness, full range of motion Heart:  Regular rate and rhythm Lungs:  Clear to auscultation bilaterally Back: No paraspinal tenderness Neurological Exam: alert and oriented to person, place, and time. Attention span and concentration intact, recent and remote memory intact, fund of knowledge intact.  Speech fluent and not dysarthric, language intact.  CN II-XII intact. Bulk and tone normal, muscle strength 5/5 throughout.  Sensation to light touch, temperature and vibration intact.  Deep tendon reflexes 2+ throughout, toes downgoing.  Finger to nose and heel to shin testing intact.  Gait normal, Romberg negative.  IMPRESSION: *** migraine without aura, without status migrainosus, *** intractable  PLAN: 1.  For preventative management, *** 2.  For abortive therapy, *** 3.  Limit use of pain relievers to no more than 2 days out of week to prevent risk of rebound or medication-overuse headache. 4.  Keep  headache diary 5.  Exercise, hydration, caffeine cessation, sleep hygiene, monitor for and avoid triggers 6. Follow up ***   Shon Millet, DO

## 2020-02-18 ENCOUNTER — Ambulatory Visit: Payer: Medicaid Other | Admitting: Neurology

## 2020-04-14 ENCOUNTER — Other Ambulatory Visit: Payer: Self-pay | Admitting: Neurology

## 2020-04-14 NOTE — Telephone Encounter (Signed)
Patient called and scheduled a follow up with Dr. Everlena Cooper 05/18/20 and is requesting refills on his nortriptyline 10 MG and Maxalt 10 MG. He is out of the medications and has been experiencing "terrible migraines."  [facility] [place] on [address] [address] [address]

## 2020-04-15 ENCOUNTER — Other Ambulatory Visit: Payer: Self-pay

## 2020-04-15 MED ORDER — NORTRIPTYLINE HCL 10 MG PO CAPS: 10.0000 mg | ORAL_CAPSULE | Freq: Every day | ORAL | 0 refills | Status: DC

## 2020-04-15 MED ORDER — RIZATRIPTAN BENZOATE 10 MG PO TABS: ORAL_TABLET | ORAL | 3 refills | Status: DC

## 2020-04-15 NOTE — Telephone Encounter (Signed)
RX sent to pharmacy  

## 2020-04-26 ENCOUNTER — Ambulatory Visit (INDEPENDENT_AMBULATORY_CARE_PROVIDER_SITE_OTHER): Payer: Medicaid Other | Admitting: Neurology

## 2020-04-26 ENCOUNTER — Other Ambulatory Visit: Payer: Self-pay

## 2020-04-26 ENCOUNTER — Encounter: Payer: Self-pay | Admitting: Neurology

## 2020-04-26 VITALS — BP 150/110 | HR 98 | Ht 72.0 in | Wt 293.2 lb

## 2020-04-26 DIAGNOSIS — G43009 Migraine without aura, not intractable, without status migrainosus: Secondary | ICD-10-CM

## 2020-04-26 NOTE — Patient Instructions (Signed)
  1. Continue nortriptyline 10mg  at bedtime 2. Take rizatriptan 10mg  at earliest onset of headache.  May repeat dose once in 2 hours if needed.  Maximum 2 tablets in 24 hours. 3. Limit use of pain relievers to no more than 2 days out of the week.  These medications include acetaminophen, NSAIDs (ibuprofen/Advil/Motrin, naproxen/Aleve, triptans (Imitrex/sumatriptan), Excedrin, and narcotics.  This will help reduce risk of rebound headaches. 4. Be aware of common food triggers:  - Caffeine:  coffee, black tea, cola, Mt. Dew  - Chocolate  - Dairy:  aged cheeses (brie, blue, cheddar, gouda, [place], provolone, [place], Swiss, etc), chocolate milk, buttermilk, sour cream, limit eggs and yogurt  - Nuts, peanut butter  - Alcohol  - Cereals/grains:  FRESH breads (fresh bagels, sourdough, doughnuts), yeast productions  - Processed/canned/aged/cured meats (pre-packaged deli meats, hotdogs)  - MSG/glutamate:  soy sauce, flavor enhancer, pickled/preserved/marinated foods  - Sweeteners:  aspartame (Equal, Nutrasweet).  Sugar and Splenda are okay  - Vegetables:  legumes (lima beans, lentils, snow peas, fava beans, pinto peans, peas, garbanzo beans), sauerkraut, onions, olives, pickles  - Fruit:  avocados, bananas, citrus fruit (orange, lemon, grapefruit), mango  - Other:  Frozen meals, macaroni and cheese 5. Routine exercise 6. Stay adequately hydrated (aim for 64 oz water daily) 7. Keep headache diary 8. Maintain proper stress management 9. Maintain proper sleep hygiene 10. Do not skip meals 11. Consider supplements:  magnesium citrate 400mg  daily, riboflavin 400mg  daily, coenzyme Q10 100mg  three times daily.

## 2020-04-26 NOTE — Progress Notes (Signed)
NEUROLOGY FOLLOW UP OFFICE NOTE  Michael Mccarthy [id number]  HISTORY OF PRESENT ILLNESS: Michael Mccarthy is a 34 year old male who follows up for migraines.  UPDATE: Prescribed nortriptyline in February.   He has noticed significant improvement.  Intensity:  moderate Duration:  45 minutes Frequency:  3 in past 6 months Frequency of abortive medication: 3 days in past 6 months. Current NSAIDS:  none Current analgesics:  none Current triptans:  rizatriptan 10mg  Current ergotamine:  none Current anti-emetic:  Zofran 4mg  Current muscle relaxants:  none Current anti-anxiolytic:  none Current sleep aide:  none Current Antihypertensive medications:  none Current Antidepressant medications:  nortriptyline 10mg  Current Anticonvulsant medications:  none Current anti-CGRP:  none Current Vitamins/Herbal/Supplements:  none Current Antihistamines/Decongestants:  none Other therapy:  none Hormone/birth control:  none  Caffeine:  Energy drinks.  Rarely coffee.  No soda. Alcohol:  rarely Diet:  Plenty of water Exercise:  Not routine.  . Depression:  no; Anxiety:  some Other pain:  Shoulder pain Sleep:  Some trouble falling asleep  HISTORY: Onset:  Teenager Location:  Starts across front but with radiate up neck and back of head. Quality:  pounding Initial Intensity:  Severe.  He denies new headache, thunderclap headache.  Sometimes wakes up with migraine. Aura:  none Premonitory Phase:  none Postdrome:  none Associated symptoms:  Nausea, vomiting, photophobia, phonophobia.  He denies associated visual disturbance, unilateral numbness or weakness. Initial Duration:  All day or sooner if he vomits Initial Frequency:  On average 2 to 3 a week Initial Frequency of abortive medication: now nothing.  Previously took high doses of ibuprofen daily. Triggers:  unknown Relieving factors:  vomiting Activity:  Aggravates.  Needs to lay down in dark room.  He had been  taking ibuprofen 1200mg  to 1600mg  daily.  He was recently seen by gastroenterology for abdominal pain.  He presented to the ED in November for abdominal pain where CT of his abdomen showed inflammatory changes.  He is being worked up for disease.  He was advised to discontinue use of NSAIDs.   Past NSAIDS:  Ibuprofen; Aleve Past analgesics:  Excedrin migraine Past abortive triptans:  none Past abortive ergotamine:  none Past muscle relaxants:  none Past anti-emetic:  none Past antihypertensive medications:  none Past antidepressant medications:  none Past anticonvulsant medications:  none Past anti-CGRP:  none Past vitamins/Herbal/Supplements:  none Past antihistamines/decongestants:  none Other past therapies:  none   Family history of headache:  Mother; grandmother; 2 aunts.  PAST MEDICAL HISTORY: No past medical history on file.  MEDICATIONS: Current Outpatient Medications on File Prior to Visit  Medication Sig Dispense Refill  . dicyclomine (BENTYL) 20 MG tablet Take 1 tablet (20 mg total) by mouth 3 (three) times daily as needed for spasms. 30 tablet 0  . nortriptyline (PAMELOR) 10 MG capsule Take 1 capsule (10 mg total) by mouth at bedtime. 30 capsule 0  . ondansetron (ZOFRAN) 4 MG tablet Take 1 tablet (4 mg total) by mouth every 8 (eight) hours as needed for nausea or vomiting. 20 tablet 3  . rizatriptan (MAXALT) 10 MG tablet Take 1 tablet earliest onset of migraine.  May repeat in 2 hours if needed.  Maximum 2 tablets in 24 hours 10 tablet 3   No current facility-administered medications on file prior to visit.    ALLERGIES: Allergies  Allergen Reactions  . Vicodin [Hydrocodone-Acetaminophen]     FAMILY HISTORY: No family history on file.  SOCIAL HISTORY: Social History   Socioeconomic History  . Marital status: Legally Separated    Spouse name: Not on file  . Number of children: Not on file  . Years of education: Not on file  . Highest education  level: Not on file  Occupational History  . Not on file  Tobacco Use  . Smoking status: Never Smoker  . Smokeless tobacco: Never Used  Substance and Sexual Activity  . Alcohol use: Not on file  . Drug use: Not on file  . Sexual activity: Not on file  Other Topics Concern  . Not on file  Social History Narrative  . Not on file   Social Determinants of Health   Financial Resource Strain:   . Difficulty of Paying Living Expenses: Not on file  Food Insecurity:   . Worried About Programme researcher, broadcasting/film/video in the Last Year: Not on file  . Ran Out of Food in the Last Year: Not on file  Transportation Needs:   . Lack of Transportation (Medical): Not on file  . Lack of Transportation (Non-Medical): Not on file  Physical Activity:   . Days of Exercise per Week: Not on file  . Minutes of Exercise per Session: Not on file  Stress:   . Feeling of Stress : Not on file  Social Connections:   . Frequency of Communication with Friends and Family: Not on file  . Frequency of Social Gatherings with Friends and Family: Not on file  . Attends Religious Services: Not on file  . Active Member of Clubs or Organizations: Not on file  . Attends Banker Meetings: Not on file  . Marital Status: Not on file  Intimate Partner Violence:   . Fear of Current or Ex-Partner: Not on file  . Emotionally Abused: Not on file  . Physically Abused: Not on file  . Sexually Abused: Not on file    PHYSICAL EXAM: Blood pressure (!) 150/110, pulse 98, height 6' (1.829 m), weight 293 lb 3.2 oz (133 kg), SpO2 98 %. General: No acute distress.  Patient appears well-groomed.   Head:  Normocephalic/atraumatic Eyes:  Fundi examined but not visualized Neck: supple, no paraspinal tenderness, full range of motion Heart:  Regular rate and rhythm Lungs:  Clear to auscultation bilaterally Back: No paraspinal tenderness Neurological Exam: alert and oriented to person, place, and time. Attention span and  concentration intact, recent and remote memory intact, fund of knowledge intact.  Speech fluent and not dysarthric, language intact.  CN II-XII intact. Bulk and tone normal, muscle strength 5/5 throughout.  Sensation to light touch, temperature and vibration intact.  Deep tendon reflexes 2+ throughout, toes downgoing.  Finger to nose and heel to shin testing intact.  Gait normal, Romberg negative.  IMPRESSION: 1.  Migraine without aura, without status migrainosus, not intractable, much improved. 2.  Elevated blood pressure.  He attributes to having drank an energy drink.  PLAN: 1.  For preventative management, nortriptyline 10mg  at bedtime 2.  For abortive therapy, rizatriptan 10mg  3.  Limit use of pain relievers to no more than 2 days out of week to prevent risk of rebound or medication-overuse headache. 4.  Keep headache diary 5.  Exercise, hydration, caffeine cessation, sleep hygiene, monitor for and avoid triggers 6. Follow up blood pressure with PCP or Urgent Care 7. Follow up 6 months.  , DO

## 2020-05-03 ENCOUNTER — Other Ambulatory Visit: Payer: Self-pay | Admitting: Neurology

## 2020-05-18 ENCOUNTER — Ambulatory Visit: Payer: Medicaid Other | Admitting: Neurology

## 2020-06-02 ENCOUNTER — Other Ambulatory Visit: Payer: Self-pay | Admitting: Neurology

## 2020-10-21 NOTE — Progress Notes (Signed)
NEUROLOGY FOLLOW UP OFFICE NOTE  Michael Mccarthy [id number]  Assessment/Plan:   1.  Migraine without aura, without status migrainosus, not intractable 2.  HTN  1.  Migraine prevention:  Nortriptyline 10mg  at bedtime 2.  Migraine prevention:  Rizatriptan 10mg  3.  Limit use of pain relievers to no more than 2 days out of week to prevent risk of rebound or medication-overuse headache. 4.  Keep headache diary 5.  Follow up with PCP regarding blood pressure 6.  Follow up with me in one year or as needed.  Subjective:  . Bolds is a 35 year old male who follows up for migraines.  UPDATE: Blood pressure was in the 180s systolic.  Started HCTZ in December.   Intensity:  moderate Duration:  45 minutes Frequency:  2 a month. Frequency of abortive medication: 3 days in past 6 months. Current NSAIDS:none Current analgesics:none Current triptans:rizatriptan 10mg  Current ergotamine:none Current anti-emetic:Zofran 4mg  Current muscle relaxants:none Current anti-anxiolytic:none Current sleep aide:none Current Antihypertensive medications:none Current Antidepressant medications:nortriptyline 10mg  Current Anticonvulsant medications:none Current anti-CGRP:none Current Vitamins/Herbal/Supplements:none Current Antihistamines/Decongestants:none Other therapy:none Hormone/birth control:none  Caffeine:Energy drinks. Rarely coffee. No soda. Alcohol:rarely Diet:Plenty of water Exercise:Not routine. 30. Depression:no; Anxiety:some Other pain:Shoulder pain Sleep: Some trouble falling asleep  HISTORY: Onset:Teenager Location:Starts across front but with radiate up neck and back of head. Quality:pounding Initial Intensity:Severe.Hedenies new headache, thunderclap headache. Sometimes wakes up with migraine. Aura:none Premonitory Phase:none Postdrome:none Associated symptoms:Nausea, vomiting,  photophobia, phonophobia.Hedenies associated visual disturbance,unilateral numbness or weakness. Initial Duration:All day or sooner if he vomits Initial Frequency:On average 2 to 3 a week Initial Frequency of abortive medication:now nothing. Previously took high doses of ibuprofen daily. Triggers:unknown Relieving factors:vomiting Activity:Aggravates. Needs to lay down in dark room.  He hadbeen taking ibuprofen 1200mg  to 1600mg  daily. He was recently seen by gastroenterology for abdominal pain. He presented to the ED in November for abdominal pain where CT of his abdomen showed inflammatory changes. He is being worked up for disease. He was advised to discontinue use of NSAIDs.   Past NSAIDS:Ibuprofen; Aleve Past analgesics:Excedrin migraine Past abortive triptans:none Past abortive ergotamine:none Past muscle relaxants:none Past anti-emetic:none Past antihypertensive medications:none Past antidepressant medications:none Past anticonvulsant medications:none Past anti-CGRP:none Past vitamins/Herbal/Supplements:none Past antihistamines/decongestants:none Other past therapies:none   Family history of headache:Mother; grandmother; 2 aunts.  PAST MEDICAL HISTORY: No past medical history on file.  MEDICATIONS: Current Outpatient Medications on File Prior to Visit  Medication Sig Dispense Refill  . dicyclomine (BENTYL) 20 MG tablet Take 1 tablet (20 mg total) by mouth 3 (three) times daily as needed for spasms. 30 tablet 0  . nortriptyline (PAMELOR) 10 MG capsule TAKE 1 CAPSULE (10 MG TOTAL) BY MOUTH AT BEDTIME. 30 capsule 5  . ondansetron (ZOFRAN) 4 MG tablet Take 1 tablet (4 mg total) by mouth every 8 (eight) hours as needed for nausea or vomiting. 20 tablet 3  . rizatriptan (MAXALT) 10 MG tablet Take 1 tablet earliest onset of migraine.  May repeat in 2 hours if needed.  Maximum 2 tablets in 24 hours 10 tablet 3   No  current facility-administered medications on file prior to visit.    ALLERGIES: Allergies  Allergen Reactions  . Vicodin [Hydrocodone-Acetaminophen]     FAMILY HISTORY: No family history on file.   Objective:  Blood pressure (!) 147/99, pulse 79, resp. rate 18, height 6' (1.829 m), weight 299 lb (135.6 kg), SpO2 98 %. General: No acute distress.  Patient appears well-groomed.  Metta Clines, DO

## 2020-10-24 ENCOUNTER — Ambulatory Visit: Payer: Medicaid Other | Admitting: Neurology

## 2020-10-24 ENCOUNTER — Encounter: Payer: Self-pay | Admitting: Neurology

## 2020-10-24 ENCOUNTER — Other Ambulatory Visit: Payer: Self-pay

## 2020-10-24 VITALS — BP 147/99 | HR 79 | Resp 18 | Ht 72.0 in | Wt 299.0 lb

## 2020-10-24 DIAGNOSIS — G43009 Migraine without aura, not intractable, without status migrainosus: Secondary | ICD-10-CM | POA: Diagnosis not present

## 2020-10-24 DIAGNOSIS — I10 Essential (primary) hypertension: Secondary | ICD-10-CM

## 2020-10-24 MED ORDER — NORTRIPTYLINE HCL 10 MG PO CAPS: 10.0000 mg | ORAL_CAPSULE | Freq: Every day | ORAL | 5 refills | Status: DC

## 2020-10-24 MED ORDER — RIZATRIPTAN BENZOATE 10 MG PO TABS: ORAL_TABLET | ORAL | 5 refills | Status: DC

## 2020-10-24 NOTE — Patient Instructions (Signed)
Refilled nortriptyline and rizatriptan Limit use of pain relievers to no more than 2 days out of week to prevent risk of rebound or medication-overuse headache. Follow up one year

## 2021-02-01 ENCOUNTER — Other Ambulatory Visit: Payer: Self-pay | Admitting: Neurology

## 2021-02-05 IMAGING — CR LUMBAR SPINE - COMPLETE 4+ VIEW
1 series · 5 of 5 positions shown · non-contrast
Comparison: None.

CLINICAL DATA: Heavy object fell on patient

EXAM:
LUMBAR SPINE - COMPLETE 4+ VIEW

[Series 1: dg lumbar spine complete 4 +v · 0.14mm/px · 5 of 5 slices shown]
[im 1/5]
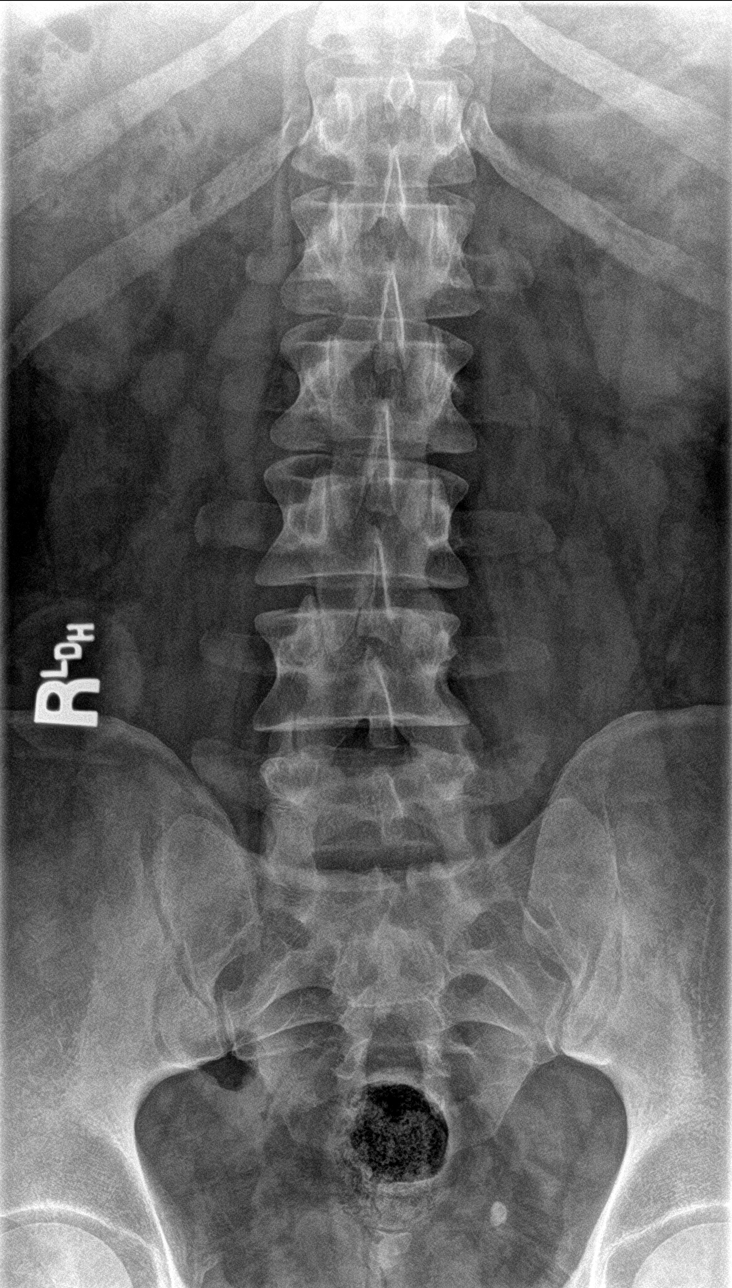
[im 2/5]
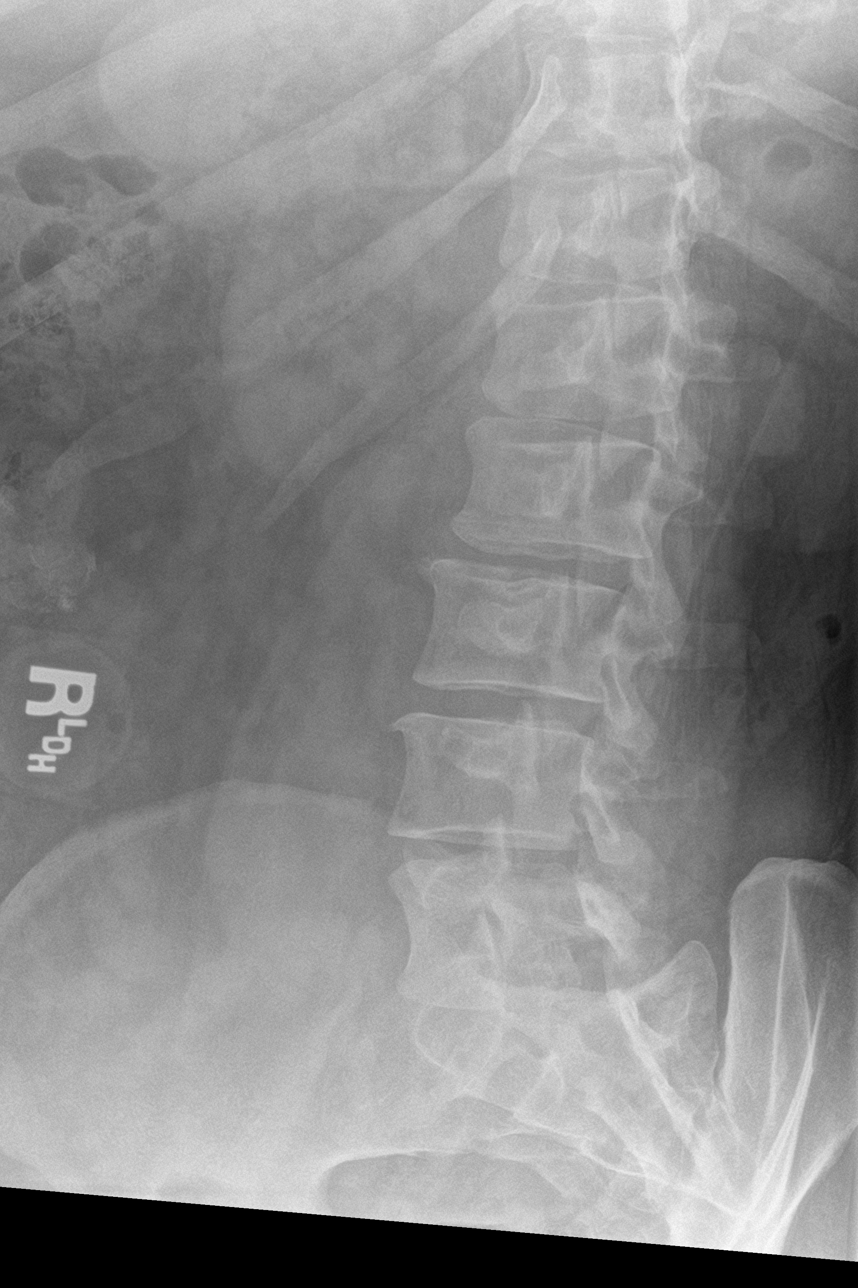
[im 3/5]
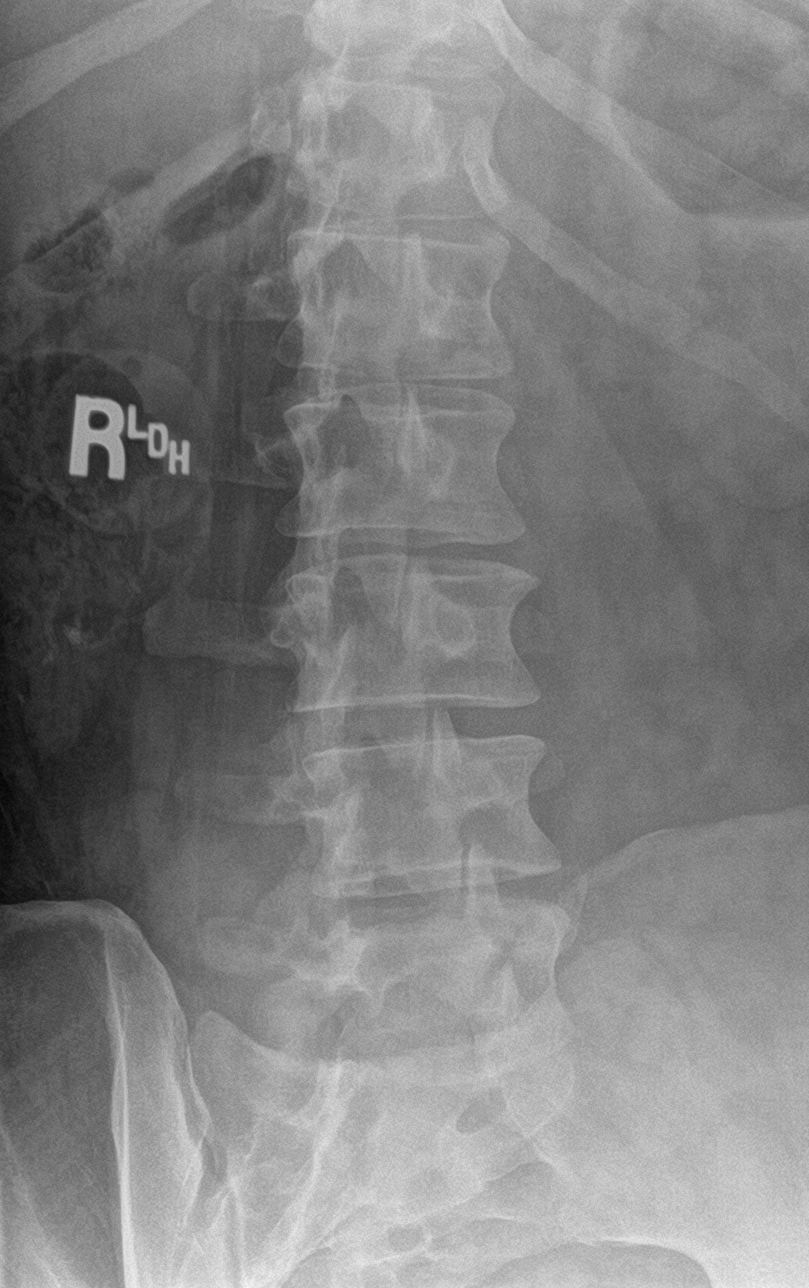
[im 4/5]
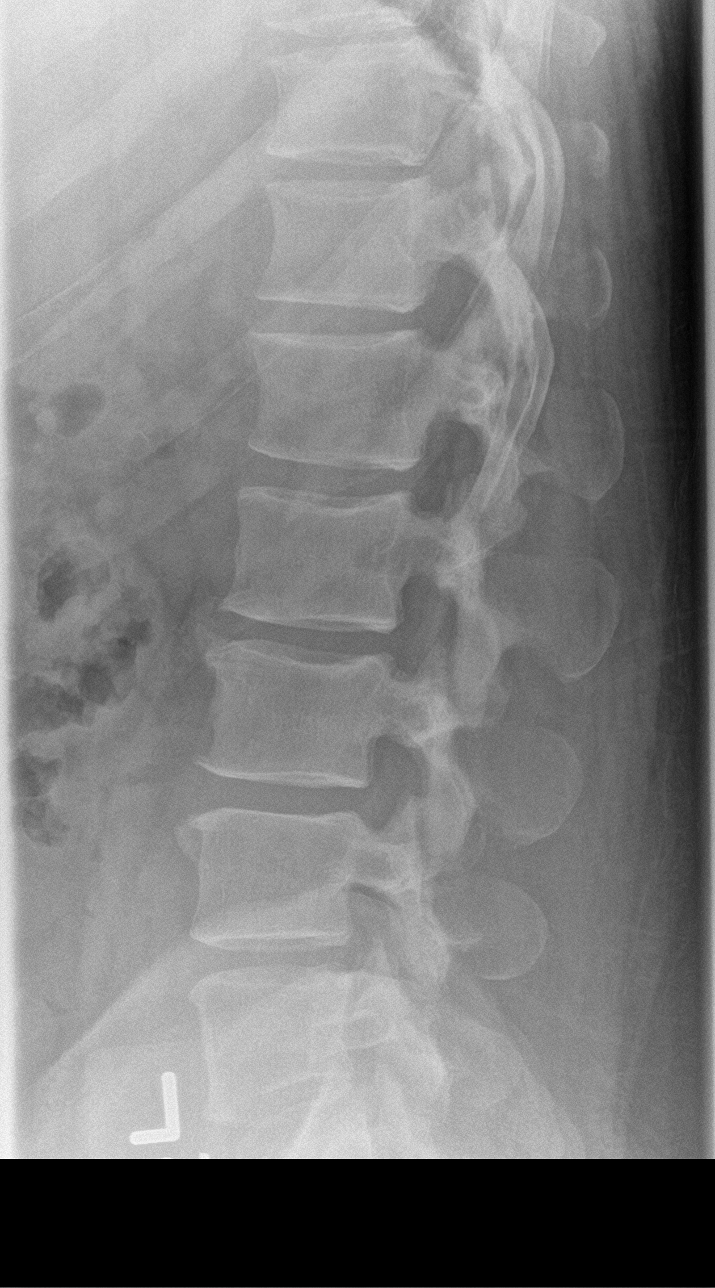
[im 5/5]
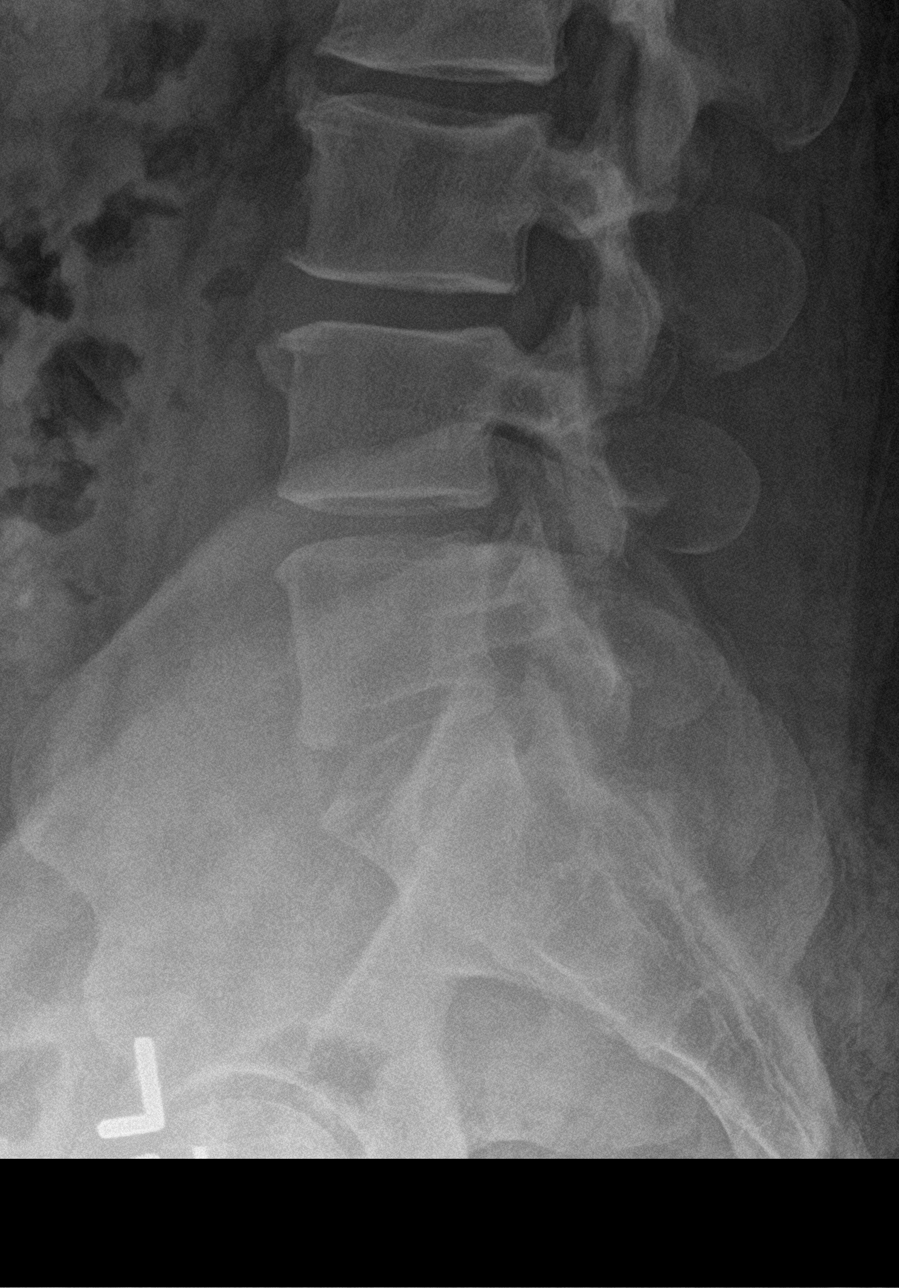

[5 of 5 positions shown; findings below may reference images not displayed]

FINDINGS: Frontal, lateral, spot lumbosacral lateral, and bilateral oblique
views were obtained. There are 5 non-rib-bearing lumbar type
vertebral bodies. There is slight lumbar dextroscoliosis. There is
no fracture or spondylolisthesis. There is slight disc space
narrowing at L4-5 and L5-S1. Other disc spaces appear unremarkable.
There is no appreciable facet arthropathy.
IMPRESSION: Slight scoliosis. Slight disc space narrowing at L4-5 and L5-S1. No
appreciable facet arthropathy. No fracture or spondylolisthesis.

## 2021-04-30 ENCOUNTER — Other Ambulatory Visit: Payer: Self-pay | Admitting: Neurology

## 2021-08-06 ENCOUNTER — Other Ambulatory Visit: Payer: Self-pay | Admitting: Neurology

## 2021-10-23 NOTE — Progress Notes (Signed)
? ?Virtual Visit via Video Note ?The purpose of this virtual visit is to provide medical care while limiting exposure to the novel coronavirus.   ? ?Consent was obtained for video visit:  Yes.   ?Answered questions that patient had about telehealth interaction:  Yes.   ?I discussed the limitations, risks, security and privacy concerns of performing an evaluation and management service by telemedicine. I also discussed with the patient that there may be a patient responsible charge related to this service. The patient expressed understanding and agreed to proceed. ? ?Pt location: Home ?Physician Location: office ?Name of referring provider:  No ref. provider found ?I connected with Jiles Prows at patients initiation/request on 10/24/2021 at 10:30 AM EST by video enabled telemedicine application and verified that I am speaking with the correct person using two identifiers. ?Pt MRN:  [id number] ?Pt DOB:  March 23, 1986 ?Video Participants:  ANGUS AMINI; ? ?Assessment and Plan:   ?1.  Migraine without aura, without status migrainosus, not intractable ?2.  HTN ?  ?1.  Migraine prevention: Medication not indicated. ?2.  Migraine prevention:  Rizatriptan 10mg  refilled ?3.  Limit use of pain relievers to no more than 2 days out of week to prevent risk of rebound or medication-overuse headache. ?4.  Keep headache diary ?5.  Follow up with PCP regarding blood pressure ?6.  Follow up with me in one year or as needed. ?  ?Subjective:  ?Michael Mccarthy is a 36 year old male who follows up for migraines. ?  ?UPDATE:  ?Blood pressure is controlled.  He lost 30 lbs.  No longer on nortriptyline.  He gets a migraine once in a while.   ?Intensity:  moderate ?Duration:  45 minutes ?Frequency:  2 a month. ?Frequency of abortive medication: 3 days in past 6 months. ?Current NSAIDS:  none ?Current analgesics:  none ?Current triptans:  rizatriptan 10mg  ?Current ergotamine:  none ?Current anti-emetic:  Zofran 4mg  ?Current muscle relaxants:   none ?Current anti-anxiolytic:  none ?Current sleep aide:  none ?Current Antihypertensive medications:  none ?Current Antidepressant medications:  none ?Current Anticonvulsant medications:  none ?Current anti-CGRP:  none ?Current Vitamins/Herbal/Supplements:  none ?Current Antihistamines/Decongestants:  none ?Other therapy:  none ?Hormone/birth control:  none ?  ?Caffeine:  Energy drinks.  Rarely coffee.  No soda. ?Alcohol:  rarely ?Diet:  Plenty of water ?Exercise:  Not routine.  20. ?Depression:  no; Anxiety:  some ?Other pain:  Shoulder pain ?Sleep:  Some trouble falling asleep ?  ?HISTORY:  ?Onset:  Teenager ?Location:  Starts across front but with radiate up neck and back of head. ?Quality:  pounding ?Initial Intensity:  Severe.  He denies new headache, thunderclap headache.  Sometimes wakes up with migraine. ?Aura:  none ?Premonitory Phase:  none ?Postdrome:  none ?Associated symptoms:  Nausea, vomiting, photophobia, phonophobia.  He denies associated visual disturbance, unilateral numbness or weakness. ?Initial Duration:  All day or sooner if he vomits ?Initial Frequency:  On average 2 to 3 a week ?Initial Frequency of abortive medication: now nothing.  Previously took high doses of ibuprofen daily. ?Triggers:  unknown ?Relieving factors:  vomiting ?Activity:  Aggravates.  Needs to lay down in dark room. ?  ?He had been taking ibuprofen 1200mg  to 1600mg  daily.  He was recently seen by gastroenterology for abdominal pain.  He presented to the ED in November for abdominal pain where CT of his abdomen showed inflammatory changes.  He is being worked up for disease.  He was  advised to discontinue use of NSAIDs. ?  ?  ?Past NSAIDS:  Ibuprofen; Aleve ?Past analgesics:  Excedrin migraine ?Past abortive triptans:  none ?Past abortive ergotamine:  none ?Past muscle relaxants:  none ?Past anti-emetic:  none ?Past antihypertensive medications:  none ?Past antidepressant medications:   nortriptyline 10mg  ?Past anticonvulsant medications:  none ?Past anti-CGRP:  none ?Past vitamins/Herbal/Supplements:  none ?Past antihistamines/decongestants:  none ?Other past therapies:  none ?  ?  ?Family history of headache:  Mother; grandmother; 2 aunts. ? ?Past Medical History: ?No past medical history on file. ? ?Medications: ?Outpatient Encounter Medications as of 10/24/2021  ?Medication Sig Note  ? dicyclomine (BENTYL) 20 MG tablet Take 1 tablet (20 mg total) by mouth 3 (three) times daily as needed for spasms. 09/14/2019: Pt reports taking PRN  ? hydrochlorothiazide (HYDRODIURIL) 25 MG tablet Take 25 mg by mouth daily.   ? nortriptyline (PAMELOR) 10 MG capsule TAKE 1 CAPSULE BY MOUTH EVERYDAY AT BEDTIME   ? ondansetron (ZOFRAN) 4 MG tablet Take 1 tablet (4 mg total) by mouth every 8 (eight) hours as needed for nausea or vomiting.   ? rizatriptan (MAXALT) 10 MG tablet Take 1 tablet earliest onset of migraine.  May repeat in 2 hours if needed.  Maximum 2 tablets in 24 hours   ? ?No facility-administered encounter medications on file as of 10/24/2021.  ? ? ?Allergies: ?Allergies  ?Allergen Reactions  ? Vicodin [Hydrocodone-Acetaminophen]   ? ? ?Family History: ?No family history on file. ? ?Observations/Objective:   ?No acute distress.  Alert and oriented.  Speech fluent and not dysarthric.  Language intact.  ? ? ?Follow Up Instructions: ?  ? -I discussed the assessment and treatment plan with the patient. The patient was provided an opportunity to ask questions and all were answered. The patient agreed with the plan and demonstrated an understanding of the instructions. ?  ?The patient was advised to call back or seek an in-person evaluation if the symptoms worsen or if the condition fails to improve as anticipated. ? ? ?12/24/2021, DO ? ?

## 2021-10-24 ENCOUNTER — Telehealth (INDEPENDENT_AMBULATORY_CARE_PROVIDER_SITE_OTHER): Payer: Medicaid Other | Admitting: Neurology

## 2021-10-24 ENCOUNTER — Encounter: Payer: Self-pay | Admitting: Neurology

## 2021-10-24 VITALS — Ht 73.0 in | Wt 275.0 lb

## 2021-10-24 DIAGNOSIS — G43009 Migraine without aura, not intractable, without status migrainosus: Secondary | ICD-10-CM

## 2021-10-24 MED ORDER — RIZATRIPTAN BENZOATE 10 MG PO TABS: ORAL_TABLET | ORAL | 5 refills | Status: AC

## 2021-11-10 ENCOUNTER — Other Ambulatory Visit: Payer: Self-pay | Admitting: Neurology
